# Patient Record
Sex: Male | Born: 1975 | Race: White | Hispanic: No | Marital: Single | Smoking: Former smoker
Health system: Southern US, Community
[De-identification: ages and names within clinical notes are randomized; demographics above are authoritative.]

## PROBLEM LIST (undated history)

## (undated) DIAGNOSIS — E0500 Thyrotoxicosis with diffuse goiter without thyrotoxic crisis or storm: Secondary | ICD-10-CM

## (undated) DIAGNOSIS — R011 Cardiac murmur, unspecified: Secondary | ICD-10-CM

## (undated) HISTORY — DX: Cardiac murmur, unspecified: R01.1

## (undated) HISTORY — PX: WISDOM TOOTH EXTRACTION: SHX21

## (undated) HISTORY — DX: Thyrotoxicosis with diffuse goiter without thyrotoxic crisis or storm: E05.00

---

## 2006-02-12 ENCOUNTER — Emergency Department (HOSPITAL_COMMUNITY): Admission: EM | Admit: 2006-02-12 | Discharge: 2006-02-12 | Payer: Self-pay | Admitting: Emergency Medicine

## 2009-10-19 ENCOUNTER — Emergency Department (HOSPITAL_COMMUNITY): Admission: EM | Admit: 2009-10-19 | Discharge: 2009-10-19 | Payer: Self-pay | Admitting: Emergency Medicine

## 2016-01-08 ENCOUNTER — Ambulatory Visit: Payer: Self-pay | Admitting: Sports Medicine

## 2016-01-15 ENCOUNTER — Encounter: Payer: Self-pay | Admitting: Sports Medicine

## 2016-01-15 ENCOUNTER — Ambulatory Visit (INDEPENDENT_AMBULATORY_CARE_PROVIDER_SITE_OTHER): Payer: Commercial Managed Care - PPO | Admitting: Sports Medicine

## 2016-01-15 VITALS — BP 138/78 | HR 70 | Ht 78.0 in | Wt 258.0 lb

## 2016-01-15 DIAGNOSIS — M67971 Unspecified disorder of synovium and tendon, right ankle and foot: Secondary | ICD-10-CM | POA: Diagnosis not present

## 2016-01-15 DIAGNOSIS — M67879 Other specified disorders of synovium and tendon, unspecified ankle and foot: Secondary | ICD-10-CM

## 2016-01-15 DIAGNOSIS — M7660 Achilles tendinitis, unspecified leg: Secondary | ICD-10-CM

## 2016-01-15 DIAGNOSIS — M67979 Unspecified disorder of synovium and tendon, unspecified ankle and foot: Secondary | ICD-10-CM | POA: Insufficient documentation

## 2016-01-15 MED ORDER — NITROGLYCERIN 0.2 MG/HR TD PT24: MEDICATED_PATCH | TRANSDERMAL | Status: DC

## 2016-01-15 NOTE — Progress Notes (Signed)
Patient ID: Golden CircleWilliam Singleton, male   DOB: 1976-07-16, 40 y.o.   MRN: [id number] HPI: Otherwise-healthy 40 yo man resents for evaluation of right Achilles tendon pain for 1.5 to 2 years. He has notice progressive fullness/knot/swelling in middle of the tendon. He denies specific trauma. Denies fluoroquinolone use. Recreational runner for fitness but feels limited in his ability to run now. Also does [place] martial art for fitness which requires balance and squatting which has become a limitation. Heel cord is tight and sore usually first thing in the morning, and at onset of exercise, but actually feels good while running and after about 5-10 minutes of warm up. He is able to complete exercise without a problem, but is then very sore for 2-3 days after. Thinks overall symptoms may have flared or gotten worse after several months of "minimalist" or barefoot running.  No other LE complaints. Has cuts and scrapes from yard work.  ROS: Denies other complaints today.  PMH: Denies  PE: BP 138/78 mmHg  Pulse 70  Ht 6\' 6"  (1.981 m)  Wt 258 lb (117.028 kg)  BMI 29.82 kg/m2 GEN: WDWN, pleasant and cooperative, NAD  CV/RESP: Nonlabored breathing, skin is warm and dry, no peripheral edema, peripheral circulation grossly intact. Distal pulses symmetrical.   NEURO: Symmetrical facial and limb movement, upper and lower sensation intact to light touch.  MSK: Thickened, ropey heel cord on right, with central nodule, approximately 2 cm proximal to insertion. Insertion of heel cord nontender, calf nontender. Standing heel raises nontender. Resisted dorsi/plantar flexion do not cause pain. Compression of heel cord reproduces pain slightly. Knees and feet are nontender with range of motion. Scattered abrasions and well-healing cuts on lower extremities c/w his history of yard work. No drainage or erythema.  A/P: Acute on chronic right heel cord pain. Ultrasound reveals normal tendon at insertion, 0.42 cm in thickness.  Nodule in tendon has thickness of 1.10 cm without evidence of significant tear or neovascularization.  Rehab exercises provided with heel lifts and nitroglycerin protocol.

## 2016-01-15 NOTE — Assessment & Plan Note (Signed)
NTG protocol Heel lifts NTG protocol Modify exercise  RE ck in 8 wks

## 2016-01-15 NOTE — Patient Instructions (Signed)

## 2016-05-01 ENCOUNTER — Ambulatory Visit: Payer: Commercial Managed Care - PPO | Admitting: Endocrinology

## 2016-05-05 ENCOUNTER — Ambulatory Visit (INDEPENDENT_AMBULATORY_CARE_PROVIDER_SITE_OTHER): Payer: Commercial Managed Care - PPO | Admitting: Endocrinology

## 2016-05-05 ENCOUNTER — Encounter: Payer: Self-pay | Admitting: Endocrinology

## 2016-05-05 VITALS — BP 132/56 | HR 80 | Ht 78.0 in | Wt 258.0 lb

## 2016-05-05 DIAGNOSIS — E0590 Thyrotoxicosis, unspecified without thyrotoxic crisis or storm: Secondary | ICD-10-CM

## 2016-05-05 LAB — T3, FREE: T3, Free: 6.5 pg/mL — ABNORMAL HIGH (ref 2.3–4.2)

## 2016-05-05 LAB — T4, FREE: Free T4: 2.03 ng/dL — ABNORMAL HIGH (ref 0.60–1.60)

## 2016-05-05 MED ORDER — METHIMAZOLE 10 MG PO TABS: 10.0000 mg | ORAL_TABLET | Freq: Three times a day (TID) | ORAL | 1 refills | Status: DC

## 2016-05-05 NOTE — Progress Notes (Signed)
Patient ID: Ian Singleton, male   DOB: Jun 18, 1976, 40 y.o.   MRN: [id number]                                                                                                                Reason for Appointment:  Hyperthyroidism, new consultation  Referring physician: [place]   History of Present Illness:   The patient started having symptoms of palpitations and some shakiness in the early part of the year He also noticed that occasionally he would feel a little sweaty and have difficulty sleeping He was having some weakness in his muscles when trying to lift weights or do squats. Although he tends to fast periodically to lose weight he thinks he has lost 5-10 pounds on his own without trying Does not complain of any consistent heat intolerance, has some fatigue.  When first evaluated with thyroid function tests in 3/17 his free T4 was mildly increased at 1.65, normal <1.12 Follow-up level of free T4 on 6/17 was 1.99  Thyroid stimulating immunoglobulin in 6/17= 189, normal <139  He has taken atenolol with only mild improvement in symptoms He thinks he felt slightly better subjectively with taking bugle weed supplement from [email or web address]  No results found for: FREET4, [employer], TSH      Medication List       Accurate as of 05/05/16  1:10 PM. Always use your most recent med list.          atenolol 25 MG tablet Commonly known as:  TENORMIN   multivitamin capsule Take 1 capsule by mouth daily.   nitroGLYCERIN 0.2 mg/hr patch Commonly known as:  NITRODUR - Dosed in mg/24 hr Place 1/4 patch to affected area daily           No past medical history on file.  No past surgical history on file.  No family history on file.  Social History:  reports that he has never smoked. He does not have any smokeless tobacco history on file. His alcohol and drug histories are not on file.  Allergies: No Known Allergies  Review of Systems:  Review of Systems  Constitutional:  Positive for weight loss. Negative for reduced appetite.  HENT:       He has noticed a change in his swallowing but still is able to swallow well  Cardiovascular: Positive for palpitations.  Gastrointestinal: Negative for diarrhea.  Endocrine: Positive for heat intolerance. Negative for polydipsia.  Musculoskeletal: Negative for joint pain and muscle cramps.  Skin: Positive for itching.       Periodic itching, mostly lower legs recently  Neurological: Positive for weakness and tremors.  Psychiatric/Behavioral:       Occasional insomnia      Examination:   BP (!) 132/56 (BP Location: Left Arm, Patient Position: Sitting, Cuff Size: Normal)   Pulse 80   Ht 6\' 6"  (1.981 m)   Wt 258 lb (117 kg)   SpO2 97%   BMI 29.81 kg/m    General Appearance:  large build He is pleasant,  not anxious or hyperkinetic.        Eyes:  mild stare present, no unusual prominence, lid lag appears absent. No swelling of the eyelids  Neck: The thyroid is minimally enlarged on the right side, difficult to palpate, left side not enlarged There is no lymphadenopathy .          Heart: normal S1 and S2, no murmurs .          Lungs: breath sounds are clear bilaterally Abdomen: no hepatosplenomegaly or other palpable abnormality  Extremities: hands are warm. No ankle edema. Neurological: Deep tendon reflexes at ankles are hyperactive. Bilateral fine tremors are minimally present. Skin: No rash, abnormal thickening of the skin on legs or pigmentation seen     Assessment/Plan:  Hyperthyroidism, Likely to be from Graves' disease    Discussed with the patient the hyperthyroidism as being an autoimmune thyroid disease.  Explained the options for treatment including antithyroid drugs and radioactive iodine.  Discussed the pros and cons for each treatment: Antithyroid drugs would be reasonable for mild disease but would need frequent followup with lab monitoring as well as potential for side effects from the  medications and uncertainty about long-term cure of the problem  Since his condition is relatively mild he probably should do well with antithyroid drugs He will start methimazole 10 mg daily and follow-up in 4 weeks Will need baseline free T4 and free T3 levels as no labs available since last month  Discussed that I-131 treatment is safe and simple to do but will result in long-term hypothyroidism that will result from ablation of the thyroid tissue and the need for lifelong supplementation and periodic monitoring.  Patient handout on hyperthyroidism given Patient understands the above discussion and treatment options. All questions were answered satisfactorily   [facility] 05/05/2016, 1:10 PM    report of consultation has been sent to PCP

## 2016-05-05 NOTE — Progress Notes (Signed)
Please let patient know that the T4 is same, T3 moderately high, ok to use 10mg  Tapazole

## 2016-05-12 ENCOUNTER — Telehealth: Payer: Self-pay | Admitting: Endocrinology

## 2016-05-12 ENCOUNTER — Other Ambulatory Visit: Payer: Self-pay

## 2016-05-12 MED ORDER — METHIMAZOLE 10 MG PO TABS: 10.0000 mg | ORAL_TABLET | Freq: Three times a day (TID) | ORAL | 1 refills | Status: DC

## 2016-05-12 NOTE — Telephone Encounter (Signed)
Refill methimazole (TAPAZOLE) 10 MG has the wrong refill date. please advise

## 2016-05-22 ENCOUNTER — Other Ambulatory Visit: Payer: Self-pay

## 2016-05-22 ENCOUNTER — Telehealth: Payer: Self-pay | Admitting: Endocrinology

## 2016-05-22 ENCOUNTER — Other Ambulatory Visit: Payer: Commercial Managed Care - PPO

## 2016-05-22 MED ORDER — METHIMAZOLE 10 MG PO TABS: 10.0000 mg | ORAL_TABLET | Freq: Three times a day (TID) | ORAL | 1 refills | Status: DC

## 2016-05-22 NOTE — Telephone Encounter (Signed)
Resent in the Methimazole rx for 60 day supply. Other rx was sent in by mistake.

## 2016-05-22 NOTE — Telephone Encounter (Signed)
PT came in and stated that for some reason the Methimazole script was only sent in for 10 days supply and he needs it to be the whole month supply.

## 2016-06-02 ENCOUNTER — Other Ambulatory Visit: Payer: Commercial Managed Care - PPO

## 2016-06-03 ENCOUNTER — Other Ambulatory Visit (INDEPENDENT_AMBULATORY_CARE_PROVIDER_SITE_OTHER): Payer: Commercial Managed Care - PPO

## 2016-06-03 DIAGNOSIS — E0590 Thyrotoxicosis, unspecified without thyrotoxic crisis or storm: Secondary | ICD-10-CM | POA: Diagnosis not present

## 2016-06-03 LAB — T3, FREE: T3, Free: 4.3 pg/mL — ABNORMAL HIGH (ref 2.3–4.2)

## 2016-06-03 LAB — T4, FREE: FREE T4: 1.32 ng/dL (ref 0.60–1.60)

## 2016-06-05 ENCOUNTER — Ambulatory Visit (INDEPENDENT_AMBULATORY_CARE_PROVIDER_SITE_OTHER): Payer: Commercial Managed Care - PPO | Admitting: Endocrinology

## 2016-06-05 ENCOUNTER — Encounter: Payer: Self-pay | Admitting: Endocrinology

## 2016-06-05 VITALS — BP 138/70 | HR 84 | Temp 98.1°F | Wt 258.0 lb

## 2016-06-05 DIAGNOSIS — E0590 Thyrotoxicosis, unspecified without thyrotoxic crisis or storm: Secondary | ICD-10-CM | POA: Diagnosis not present

## 2016-06-05 NOTE — Progress Notes (Signed)
Patient ID: Ian Singleton, male   DOB: 17-Apr-1976, 40 y.o.   MRN: [id number]                                                                                                                Reason for Appointment:  Hyperthyroidism, follow-up  Referring physician: Stoneking   History of Present Illness:   The patient started having symptoms of palpitations and some shakiness in the early part of the year He also noticed that occasionally he would feel a little sweaty and have difficulty sleeping He was having some weakness in his muscles when trying to lift weights or do squats. Although he tends to fast periodically to lose weight he thinks he has lost 5-10 pounds on his own without trying No significant heat intolerance, has had fatigue  When first evaluated with thyroid function tests in 3/17 his free T4 was mildly increased at 1.65, normal <1.12 Follow-up level of free T4 on 6/17 was 1.99  Thyroid stimulating immunoglobulin in 6/17= 189, normal <139  He has taken methimazole 30 mg daily since his initial consultation in 7/17  Subjectively has started feeling better with less palpitations, not as hot and his energy level has improved No side effects from methimazole although he has difficulty trying to take it regularly around lunchtime However has been able to take 3 tablets a day consistently  His labs show improvement  Wt Readings from Last 3 Encounters:  06/05/16 258 lb (117 kg)  05/05/16 258 lb (117 kg)  01/15/16 258 lb (117 kg)    Lab Results  Component Value Date   FREET4 1.32 06/03/2016   FREET4 2.03 (H) 05/05/2016   T3FREE 4.3 (H) 06/03/2016   T3FREE 6.5 (H) 05/05/2016        Medication List       Accurate as of 06/05/16 10:31 AM. Always use your most recent med list.          methimazole 10 MG tablet Commonly known as:  TAPAZOLE Take 1 tablet (10 mg total) by mouth 3 (three) times daily.   multivitamin capsule Take 1 capsule by mouth daily.     nitroGLYCERIN 0.2 mg/hr patch Commonly known as:  NITRODUR - Dosed in mg/24 hr Place 1/4 patch to affected area daily           No past medical history on file.  No past surgical history on file.  Family History  Problem Relation Age of Onset  . Hypothyroidism Mother     Social History:  reports that he has never smoked. He does not have any smokeless tobacco history on file. His alcohol and drug histories are not on file.  Allergies: No Known Allergies  `  Review of Systems    Examination:   BP 138/70 (BP Location: Left Arm, Patient Position: Sitting, Cuff Size: Normal)   Pulse 84   Temp 98.1 F (36.7 C) (Oral)   Wt 258 lb (117 kg)   BMI 29.81 kg/m   Does not look anxious Neck:  The thyroid is minimally enlarged on the right side, difficult to palpate, left side not enlarged   Extremities: hands are warm and slightly moist. No ankle edema. Neurological: Deep tendon reflexes at ankles are hyperactive.  No significant tremor, uses very minimal     Assessment/Plan:   Hyperthyroidism,  from Graves' disease    He is subjectively doing better with starting methimazole 30 mg a day He does still have occasional palpitations and heart rate is slightly high Reflexes are slightly brisk again His thyroid levels are significantly better with free T3 level only minimally increased currently  He will continue the same dose of 30 mg Tapazole daily for another month He can take 20 mg in the morning and 10 mg the evening for convenience He is interested in knowing whether his thyrotropin binding immunoglobulin is improving and will check this on the next visit Also discussed possibility of I-131 treatment if he does not appear to be having improvement in his immunoglobulin levels and continuing to require larger doses of methimazole after about 6 months  Marielle Mantione 06/05/2016, 10:31 AM

## 2016-07-01 ENCOUNTER — Other Ambulatory Visit: Payer: Commercial Managed Care - PPO

## 2016-07-02 ENCOUNTER — Other Ambulatory Visit (INDEPENDENT_AMBULATORY_CARE_PROVIDER_SITE_OTHER): Payer: Commercial Managed Care - PPO

## 2016-07-02 DIAGNOSIS — E0590 Thyrotoxicosis, unspecified without thyrotoxic crisis or storm: Secondary | ICD-10-CM | POA: Diagnosis not present

## 2016-07-02 LAB — T4, FREE: FREE T4: 0.76 ng/dL (ref 0.60–1.60)

## 2016-07-02 LAB — T3, FREE: T3 FREE: 3.4 pg/mL (ref 2.3–4.2)

## 2016-07-02 LAB — TSH: TSH: 0.01 u[IU]/mL — ABNORMAL LOW (ref 0.35–4.50)

## 2016-07-03 LAB — THYROTROPIN RECEPTOR AUTOABS: THYROTROPIN RECEPTOR AB: 1.37 IU/L (ref 0.00–1.75)

## 2016-07-04 ENCOUNTER — Encounter: Payer: Self-pay | Admitting: Endocrinology

## 2016-07-04 ENCOUNTER — Ambulatory Visit (INDEPENDENT_AMBULATORY_CARE_PROVIDER_SITE_OTHER): Payer: Commercial Managed Care - PPO | Admitting: Endocrinology

## 2016-07-04 VITALS — BP 122/76 | HR 74 | Temp 98.0°F | Resp 16 | Ht 78.0 in | Wt 263.6 lb

## 2016-07-04 DIAGNOSIS — E0590 Thyrotoxicosis, unspecified without thyrotoxic crisis or storm: Secondary | ICD-10-CM

## 2016-07-04 NOTE — Progress Notes (Signed)
Patient ID: Ian Singleton, male   DOB: [birth year], 40 y.o.   MRN: [id number]                                                                                                                Reason for Appointment:  Hyperthyroidism, follow-up  Referring physician: Stoneking   History of Present Illness:   The patient started having symptoms of palpitations and some shakiness in the early part of the year He also noticed that occasionally he would feel a little sweaty and have difficulty sleeping He was having some weakness in his muscles when trying to lift weights or do squats. Although he tends to fast periodically to lose weight he thinks he has lost 5-10 pounds on his own without trying No significant heat intolerance, has had fatigue  When first evaluated with thyroid function tests in 3/17 his free T4 was mildly increased at 1.65, normal <1.12 Follow-up level of free T4 on 6/17 was 1.99  Thyroid stimulating immunoglobulin in 6/17= 189, normal <139  He has taken methimazole 30 mg daily since his initial consultation in 7/17, the dose was not reduced on his last visit as free T3 was still slightly high  Subjectively has done even better than his last visit with no further palpitations, heat intolerance or fatigue No side effects from methimazole  Also has been able to take 3 tablets a day consistently  His labs show free T4 and T3 normal and thyrotropin receptor antibody is normal at 1.37  Wt Readings from Last 3 Encounters:  07/04/16 263 lb 9.6 oz (119.6 kg)  06/05/16 258 lb (117 kg)  05/05/16 258 lb (117 kg)    Lab Results  Component Value Date   FREET4 0.76 07/02/2016   FREET4 1.32 06/03/2016   FREET4 2.03 (H) 05/05/2016   T3FREE 3.4 07/02/2016   T3FREE 4.3 (H) 06/03/2016   T3FREE 6.5 (H) 05/05/2016   TSH 0.01 (L) 07/02/2016        Medication List       Accurate as of 07/04/16  9:16 AM. Always use your most recent med list.          methimazole 10 MG  tablet Commonly known as:  TAPAZOLE Take 1 tablet (10 mg total) by mouth 3 (three) times daily.   multivitamin capsule Take 1 capsule by mouth daily.   nitroGLYCERIN 0.2 mg/hr patch Commonly known as:  NITRODUR - Dosed in mg/24 hr Place 1/4 patch to affected area daily           No past medical history on file.  No past surgical history on file.  Family History  Problem Relation Age of Onset  . Hypothyroidism Mother     Social History:  reports that he has never smoked. He does not have any smokeless tobacco history on file. His alcohol and drug histories are not on file.  Allergies: No Known Allergies  `  Review of Systems    Examination:   BP 122/76   Pulse 74  Temp 98 F (36.7 C)   Resp 16   Ht 6\' 6"  (1.981 m)   Wt 263 lb 9.6 oz (119.6 kg)   SpO2 98%   BMI 30.46 kg/m   Neck: The thyroid is Not palpable  Neurological: Deep tendon reflexes at biceps are normal Skin is slightly warm      Assessment/Plan:   Hyperthyroidism,  from Graves' disease    He is symptomatically much improved with methimazole 30 mg daily He looks euthyroid and the thyroid is not palpable His thyroid levels are back to normal as also the thyrotropin receptor antibody which is fairly quick  He will not reduce his dose to 15 mg a day and follow-up in 1 month again, hopefully can taper off his methimazole fairly quickly Discussed possibility of hypothyroid symptoms  Ian Singleton 07/04/2016, 9:16 AM

## 2016-07-04 NOTE — Patient Instructions (Addendum)
Take 1 1/2 pills in am of Methimazole

## 2016-08-15 ENCOUNTER — Other Ambulatory Visit (INDEPENDENT_AMBULATORY_CARE_PROVIDER_SITE_OTHER): Payer: Commercial Managed Care - PPO

## 2016-08-15 DIAGNOSIS — E0590 Thyrotoxicosis, unspecified without thyrotoxic crisis or storm: Secondary | ICD-10-CM | POA: Diagnosis not present

## 2016-08-15 LAB — TSH: TSH: 1.62 u[IU]/mL (ref 0.35–4.50)

## 2016-08-15 LAB — T4, FREE: FREE T4: 0.57 ng/dL — AB (ref 0.60–1.60)

## 2016-08-15 LAB — T3, FREE: T3, Free: 3 pg/mL (ref 2.3–4.2)

## 2016-08-19 ENCOUNTER — Ambulatory Visit (INDEPENDENT_AMBULATORY_CARE_PROVIDER_SITE_OTHER): Payer: Commercial Managed Care - PPO | Admitting: Endocrinology

## 2016-08-19 ENCOUNTER — Encounter: Payer: Self-pay | Admitting: Endocrinology

## 2016-08-19 VITALS — BP 128/78 | HR 74 | Ht 78.0 in | Wt 273.0 lb

## 2016-08-19 DIAGNOSIS — E0590 Thyrotoxicosis, unspecified without thyrotoxic crisis or storm: Secondary | ICD-10-CM | POA: Diagnosis not present

## 2016-08-19 NOTE — Patient Instructions (Signed)
1/2 PILL DAILY

## 2016-08-19 NOTE — Progress Notes (Signed)
Patient ID: Ian CircleWilliam Singleton, male   DOB: [birth year], 40 y.o.   MRN: [id number]                                                                                                                Reason for Appointment:  Hyperthyroidism, follow-up  Referring physician: Stoneking   History of Present Illness:   The patient started having symptoms of palpitations and some shakiness in the early part of the year He also noticed that occasionally he would feel a little sweaty and have difficulty sleeping He was having some weakness in his muscles when trying to lift weights or do squats. Although he tends to fast periodically to lose weight he thinks he has lost 5-10 pounds on his own without trying No significant heat intolerance, has had fatigue  When first evaluated with thyroid function tests in 3/17 his free T4 was mildly increased at 1.65, normal <1.12 Follow-up level of free T4 on 6/17 was 1.99  Thyroid stimulating immunoglobulin in 6/17= 189, normal <139  He had taken methimazole 30 mg daily since his initial consultation in 7/17, the dose was reduced on his last visit as free T4 was down to 0.76 Currently taking 15 mg a day Subjectively has done fairly well and does not complain of fatigue No cold intolerance but has gained weight He still does exercise periodically  His labs show free T4 below normal now and TSH back in normal range In September thyrotropin receptor antibody is normal at 1.37  Wt Readings from Last 3 Encounters:  08/19/16 273 lb (123.8 kg)  07/04/16 263 lb 9.6 oz (119.6 kg)  06/05/16 258 lb (117 kg)    Lab Results  Component Value Date   FREET4 0.57 (L) 08/15/2016   FREET4 0.76 07/02/2016   FREET4 1.32 06/03/2016   T3FREE 3.0 08/15/2016   T3FREE 3.4 07/02/2016   T3FREE 4.3 (H) 06/03/2016   TSH 1.62 08/15/2016   TSH 0.01 (L) 07/02/2016        Medication List       Accurate as of 08/19/16 11:01 AM. Always use your most recent med list.          methimazole 10 MG tablet Commonly known as:  TAPAZOLE Take 1 tablet (10 mg total) by mouth 3 (three) times daily.   multivitamin capsule Take 1 capsule by mouth daily.   nitroGLYCERIN 0.2 mg/hr patch Commonly known as:  NITRODUR - Dosed in mg/24 hr Place 1/4 patch to affected area daily           No past medical history on file.  No past surgical history on file.  Family History  Problem Relation Age of Onset  . Hypothyroidism Mother     Social History:  reports that he has never smoked. He does not have any smokeless tobacco history on file. His alcohol and drug histories are not on file.  Allergies: No Known Allergies  `  Review of Systems    Examination:   BP 128/78   Pulse  74   Ht 6\' 6"  (1.981 m)   Wt 273 lb (123.8 kg)   SpO2 98%   BMI 31.55 kg/m   Neck: The thyroid is Not palpable  Neurological: Deep tendon reflexes at biceps are normal      Assessment/Plan:   Hyperthyroidism,  from Graves' disease    He is symptomatically Doing well His thyroid levels have come down fairly quickly now and is mildly hypothyroid with low free T4 TSH is back to normal Currently on 15 mg methimazole  Since his thyrotropin receptor antibody was normal in September he is likely in remission  He will cut his methimazole to half tablet daily for 2 weeks and then stop Follow-up in about a month  Ian Singleton 08/19/2016, 11:01 AM

## 2016-10-31 ENCOUNTER — Other Ambulatory Visit: Payer: Commercial Managed Care - PPO

## 2016-11-02 ENCOUNTER — Other Ambulatory Visit: Payer: Self-pay | Admitting: Endocrinology

## 2016-11-03 ENCOUNTER — Other Ambulatory Visit (INDEPENDENT_AMBULATORY_CARE_PROVIDER_SITE_OTHER): Payer: Commercial Managed Care - PPO

## 2016-11-03 DIAGNOSIS — E0590 Thyrotoxicosis, unspecified without thyrotoxic crisis or storm: Secondary | ICD-10-CM | POA: Diagnosis not present

## 2016-11-03 LAB — T4, FREE: FREE T4: 0.88 ng/dL (ref 0.60–1.60)

## 2016-11-03 LAB — TSH: TSH: 0.45 u[IU]/mL (ref 0.35–4.50)

## 2016-11-04 LAB — THYROTROPIN RECEPTOR AUTOABS: THYROTROPIN RECEPTOR AB: 1.08 IU/L (ref 0.00–1.75)

## 2016-11-05 ENCOUNTER — Ambulatory Visit: Payer: Commercial Managed Care - PPO | Admitting: Endocrinology

## 2017-06-22 ENCOUNTER — Other Ambulatory Visit: Payer: Commercial Managed Care - PPO

## 2017-06-24 ENCOUNTER — Other Ambulatory Visit: Payer: Self-pay | Admitting: Endocrinology

## 2017-06-24 ENCOUNTER — Other Ambulatory Visit (INDEPENDENT_AMBULATORY_CARE_PROVIDER_SITE_OTHER): Payer: Commercial Managed Care - PPO

## 2017-06-24 DIAGNOSIS — E0590 Thyrotoxicosis, unspecified without thyrotoxic crisis or storm: Secondary | ICD-10-CM

## 2017-06-24 LAB — TSH: TSH: 1.74 u[IU]/mL (ref 0.35–4.50)

## 2017-06-24 LAB — T4, FREE: Free T4: 0.84 ng/dL (ref 0.60–1.60)

## 2017-06-24 LAB — T3, FREE: T3, Free: 3.1 pg/mL (ref 2.3–4.2)

## 2017-06-25 ENCOUNTER — Ambulatory Visit (INDEPENDENT_AMBULATORY_CARE_PROVIDER_SITE_OTHER): Payer: Commercial Managed Care - PPO | Admitting: Endocrinology

## 2017-06-25 VITALS — BP 138/82 | HR 60 | Temp 98.2°F | Ht 78.0 in | Wt 281.6 lb

## 2017-06-25 DIAGNOSIS — E0590 Thyrotoxicosis, unspecified without thyrotoxic crisis or storm: Secondary | ICD-10-CM

## 2017-06-25 NOTE — Progress Notes (Signed)
Patient ID: Ian Singleton, male   DOB: 08-Apr-1976, 41 y.o.   MRN: [id number]                                                                                                                Reason for Appointment:  Hyperthyroidism, follow-up  Referring physician: Stoneking   History of Present Illness:   The patient started having symptoms of palpitations and some shakiness in the early part of the year He also noticed that occasionally he would feel a little sweaty and have difficulty sleeping He was having some weakness in his muscles when trying to lift weights or do squats. Although he tends to fast periodically to lose weight he thinks he has lost 5-10 pounds on his own without trying No significant heat intolerance, has had fatigue  When first evaluated with thyroid function tests in 3/17 his free T4 was mildly increased at 1.65, normal <1.12 Follow-up level of free T4 on 6/17 was 1.99  Thyroid stimulating immunoglobulin in 6/17= 189, normal <139  He had taken methimazole 30 mg daily since his initial consultation in 7/17, the dose was reduced on his last visit as free T4 was down to 0.76  On his last visit in 11/17 his free T4 was mildly decreased on 15 mg methimazole He was told to cut the dose down to 5 mg for 2 weeks and then stop  Although he had labs done in 1/18 that were normal he did not come in for office visit  RECENT history: About 2 months ago he started having some palpitations, feeling hot and anxious feeling On his own he started back on 5 mg methimazole without consulting [place] or having any labs Currently he feels better with no further anxiety, palpitations, fatigue or heat intolerance He appears to have gained weight since last year  His labs show normal free T4 and also TSH is normal now   Most recent thyrotropin receptor antibody is also normal in 1/18  Wt Readings from Last 3 Encounters:  06/25/17 281 lb 9.6 oz (127.7 kg)  08/19/16 273 lb (123.8 kg)    07/04/16 263 lb 9.6 oz (119.6 kg)    Lab Results  Component Value Date   FREET4 0.84 06/24/2017   FREET4 0.88 11/03/2016   FREET4 0.57 (L) 08/15/2016   T3FREE 3.1 06/24/2017   T3FREE 3.0 08/15/2016   T3FREE 3.4 07/02/2016   TSH 1.74 06/24/2017   TSH 0.45 11/03/2016   TSH 1.62 08/15/2016      Allergies as of 06/25/2017   No Known Allergies     Medication List       Accurate as of 06/25/17  3:54 PM. Always use your most recent med list.          methimazole 10 MG tablet Commonly known as:  TAPAZOLE Take 1 tablet (10 mg total) by mouth 3 (three) times daily.   multivitamin capsule Take 1 capsule by mouth daily.   nitroGLYCERIN 0.2 mg/hr patch Commonly known as:  NITRODUR - Dosed  in mg/24 hr Place 1/4 patch to affected area daily           No past medical history on file.  No past surgical history on file.  Family History  Problem Relation Age of Onset  . Hypothyroidism Mother     Social History:  reports that he has never smoked. He does not have any smokeless tobacco history on file. His alcohol and drug histories are not on file.  Allergies: No Known Allergies  `  Review of Systems    Examination:   BP 138/82 (BP Location: Left Arm, Patient Position: Sitting, Cuff Size: Normal)   Pulse 60   Temp 98.2 F (36.8 C) (Oral)   Ht  (1.981 m)   Wt 281 lb 9.6 oz (127.7 kg)   SpO2 96%   BMI 32.54 kg/m   Neck: The thyroid is not palpable  Deep tendon reflexes at biceps are normal No tremor present     Assessment/Plan:   Hyperthyroidism,  from Graves' disease    Although he had not required any medication and his Graves' disease was in remission as of 1/18 he appears to have had a recurrence at least in the last couple of months  He has not been regular with his follow-up and has been taking 5 mg methimazole on his own without repeating baseline labs With 5 mg daily he symptomatically doing well and his thyroid levels are back to  normal  Advised patient that he needs regular follow-up and his treatment needs to be supervised in the office regularly Also discussed that if he has any tendency to persistent hyperthyroidism or several recurrences then he should have I-131 treatment  He will come back in 2 months for follow-up labs and repeat thyrotropin receptor antibody  [facility] 06/25/2017, 3:54 PM

## 2017-06-26 MED ORDER — METHIMAZOLE 5 MG PO TABS: 5.0000 mg | ORAL_TABLET | Freq: Every day | ORAL | 1 refills | Status: DC

## 2017-08-17 ENCOUNTER — Other Ambulatory Visit (INDEPENDENT_AMBULATORY_CARE_PROVIDER_SITE_OTHER): Payer: Commercial Managed Care - PPO

## 2017-08-17 DIAGNOSIS — E0590 Thyrotoxicosis, unspecified without thyrotoxic crisis or storm: Secondary | ICD-10-CM

## 2017-08-17 LAB — TSH: TSH: 0.62 u[IU]/mL (ref 0.35–4.50)

## 2017-08-17 LAB — T4, FREE: Free T4: 0.95 ng/dL (ref 0.60–1.60)

## 2017-08-18 LAB — THYROTROPIN RECEPTOR AUTOABS: THYROTROPIN RECEPTOR AB: 0.53 IU/L (ref 0.00–1.75)

## 2017-08-20 ENCOUNTER — Ambulatory Visit (INDEPENDENT_AMBULATORY_CARE_PROVIDER_SITE_OTHER): Payer: Commercial Managed Care - PPO | Admitting: Endocrinology

## 2017-08-20 ENCOUNTER — Encounter: Payer: Self-pay | Admitting: Endocrinology

## 2017-08-20 VITALS — BP 140/90 | HR 79 | Ht 78.0 in | Wt 282.6 lb

## 2017-08-20 DIAGNOSIS — E0590 Thyrotoxicosis, unspecified without thyrotoxic crisis or storm: Secondary | ICD-10-CM | POA: Diagnosis not present

## 2017-08-20 NOTE — Progress Notes (Signed)
Patient ID: Ian Singleton, male   DOB: 1975/12/15, 41 y.o.   MRN: [id number]                                                                                                                Reason for Appointment:  Hyperthyroidism, follow-up  Referring physician: [place]   History of Present Illness:   The patient started having symptoms of palpitations and some shakiness in the early part of the year He also noticed that occasionally he would feel a little sweaty and have difficulty sleeping He was having some weakness in his muscles when trying to lift weights or do squats. Although he tends to fast periodically to lose weight he thinks he has lost 5-10 pounds on his own without trying No significant heat intolerance, has had fatigue  When first evaluated with thyroid function tests in 3/17 his free T4 was mildly increased at 1.65, normal <1.12 Follow-up level of free T4 on 6/17 was 1.99  Thyroid stimulating immunoglobulin in 6/17= 189, normal <139  He had taken methimazole 30 mg daily since his initial consultation in 04/2016, the dose was reduced on his last visit as free T4 was down to 0.76  On his last visit in 11/17 his free T4 was mildly decreased on 15 mg methimazole He was told to cut the dose down to 5 mg for 2 weeks and then stop  Although he had labs done in 1/18 that were normal he did not come in for office visit  RECENT history: In July 2018 he started having some palpitations, feeling hot and anxious feeling On his own he started back on 5 mg methimazole without consulting us or having any labs Subsequently he has had good control of these symptoms Since his free T4 and TSH were quite normal in September he was continued on the same dose  Currently he feels better with no further agitation, palpitations, fatigue or weight change  His labs show normal free T4 and also TSH is normal although slightly lower  Most recent thyrotropin receptor antibody is also normal  and improved  Wt Readings from Last 3 Encounters:  08/20/17 282 lb 9.6 oz (128.2 kg)  06/25/17 281 lb 9.6 oz (127.7 kg)  08/19/16 273 lb (123.8 kg)    Lab Results  Component Value Date   FREET4 0.95 08/17/2017   FREET4 0.84 06/24/2017   FREET4 0.88 11/03/2016   T3FREE 3.1 06/24/2017   T3FREE 3.0 08/15/2016   T3FREE 3.4 07/02/2016   TSH 0.62 08/17/2017   TSH 1.74 06/24/2017   TSH 0.45 11/03/2016   Lab Results  Component Value Date   THYROTRECAB 0.53 08/17/2017   THYROTRECAB 1.08 11/03/2016   THYROTRECAB 1.37 07/02/2016      Allergies as of 08/20/2017   No Known Allergies     Medication List        Accurate as of 08/20/17  9:09 AM. Always use your most recent med list.          methimazole 5 MG  tablet Commonly known as:  TAPAZOLE Take 1 tablet (5 mg total) by mouth daily.   multivitamin capsule Take 1 capsule by mouth daily.   nitroGLYCERIN 0.2 mg/hr patch Commonly known as:  NITRODUR - Dosed in mg/24 hr Place 1/4 patch to affected area daily           No past medical history on file.  No past surgical history on file.  Family History  Problem Relation Age of Onset  . Hypothyroidism Mother     Social History:  reports that  has never smoked. he has never used smokeless tobacco. His alcohol and drug histories are not on file.  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    He has been on treatment with methimazole again since July With 5 mg at times on his thyroid levels are back to normal Objectively he is doing well, no thyroid enlargement May have minimal ophthalmopathy  which is stable also  Although his thyrotropin receptor antibody is low normal is TSH is still on the lower end of the range and unlikely to be able to get off his methimazole at this time He was stay on the same dose for another 2 months and we will review his thyroid levels again, may be able to reduce his methimazole when his TSH is high normal All questions answered  Sylwia Cuervo 08/20/2017, 9:09 AM

## 2017-09-07 ENCOUNTER — Other Ambulatory Visit: Payer: Self-pay | Admitting: Endocrinology

## 2018-03-29 DIAGNOSIS — E0590 Thyrotoxicosis, unspecified without thyrotoxic crisis or storm: Secondary | ICD-10-CM | POA: Diagnosis not present

## 2018-03-29 DIAGNOSIS — Z79899 Other long term (current) drug therapy: Secondary | ICD-10-CM | POA: Diagnosis not present

## 2018-03-29 DIAGNOSIS — Z0000 Encounter for general adult medical examination without abnormal findings: Secondary | ICD-10-CM | POA: Diagnosis not present

## 2018-04-17 ENCOUNTER — Emergency Department (HOSPITAL_COMMUNITY): Payer: Commercial Managed Care - PPO

## 2018-04-17 ENCOUNTER — Other Ambulatory Visit: Payer: Self-pay

## 2018-04-17 ENCOUNTER — Emergency Department (HOSPITAL_COMMUNITY)
Admission: EM | Admit: 2018-04-17 | Discharge: 2018-04-17 | Disposition: A | Discharge status: Home / Self Care | Payer: Commercial Managed Care - PPO | Attending: Emergency Medicine | Admitting: Emergency Medicine

## 2018-04-17 ENCOUNTER — Encounter (HOSPITAL_COMMUNITY): Payer: Self-pay

## 2018-04-17 DIAGNOSIS — Y999 Unspecified external cause status: Secondary | ICD-10-CM | POA: Insufficient documentation

## 2018-04-17 DIAGNOSIS — S299XXA Unspecified injury of thorax, initial encounter: Secondary | ICD-10-CM | POA: Diagnosis not present

## 2018-04-17 DIAGNOSIS — Y9389 Activity, other specified: Secondary | ICD-10-CM | POA: Insufficient documentation

## 2018-04-17 DIAGNOSIS — S42032A Displaced fracture of lateral end of left clavicle, initial encounter for closed fracture: Secondary | ICD-10-CM | POA: Diagnosis not present

## 2018-04-17 DIAGNOSIS — S0990XA Unspecified injury of head, initial encounter: Secondary | ICD-10-CM | POA: Diagnosis not present

## 2018-04-17 DIAGNOSIS — S2242XA Multiple fractures of ribs, left side, initial encounter for closed fracture: Secondary | ICD-10-CM | POA: Diagnosis not present

## 2018-04-17 DIAGNOSIS — S22000A Wedge compression fracture of unspecified thoracic vertebra, initial encounter for closed fracture: Secondary | ICD-10-CM | POA: Diagnosis not present

## 2018-04-17 DIAGNOSIS — Y92410 Unspecified street and highway as the place of occurrence of the external cause: Secondary | ICD-10-CM | POA: Insufficient documentation

## 2018-04-17 DIAGNOSIS — S060X0A Concussion without loss of consciousness, initial encounter: Secondary | ICD-10-CM

## 2018-04-17 DIAGNOSIS — S42102A Fracture of unspecified part of scapula, left shoulder, initial encounter for closed fracture: Secondary | ICD-10-CM | POA: Diagnosis not present

## 2018-04-17 DIAGNOSIS — S40912A Unspecified superficial injury of left shoulder, initial encounter: Secondary | ICD-10-CM | POA: Diagnosis present

## 2018-04-17 LAB — CBC WITH DIFFERENTIAL/PLATELET
ABS IMMATURE GRANULOCYTES: 0.1 10*3/uL (ref 0.0–0.1)
Basophils Absolute: 0.1 10*3/uL (ref 0.0–0.1)
Basophils Relative: 0 %
EOS PCT: 0 %
Eosinophils Absolute: 0 10*3/uL (ref 0.0–0.7)
HCT: 47.1 % (ref 39.0–52.0)
HEMOGLOBIN: 15.6 g/dL (ref 13.0–17.0)
IMMATURE GRANULOCYTES: 1 %
LYMPHS ABS: 1.6 10*3/uL (ref 0.7–4.0)
LYMPHS PCT: 9 %
MCH: 30.6 pg (ref 26.0–34.0)
MCHC: 33.1 g/dL (ref 30.0–36.0)
MCV: 92.4 fL (ref 78.0–100.0)
Monocytes Absolute: 1.6 10*3/uL — ABNORMAL HIGH (ref 0.1–1.0)
Monocytes Relative: 10 %
NEUTROS PCT: 80 %
Neutro Abs: 13.5 10*3/uL — ABNORMAL HIGH (ref 1.7–7.7)
Platelets: 265 10*3/uL (ref 150–400)
RBC: 5.1 MIL/uL (ref 4.22–5.81)
RDW: 11.9 % (ref 11.5–15.5)
WBC: 16.9 10*3/uL — AB (ref 4.0–10.5)

## 2018-04-17 LAB — COMPREHENSIVE METABOLIC PANEL
ALBUMIN: 4.6 g/dL (ref 3.5–5.0)
ALK PHOS: 78 U/L (ref 38–126)
ALT: 33 U/L (ref 0–44)
AST: 28 U/L (ref 15–41)
Anion gap: 9 (ref 5–15)
BUN: 11 mg/dL (ref 6–20)
CO2: 25 mmol/L (ref 22–32)
Calcium: 9.7 mg/dL (ref 8.9–10.3)
Chloride: 107 mmol/L (ref 98–111)
Creatinine, Ser: 0.98 mg/dL (ref 0.61–1.24)
GFR calc Af Amer: >60 mL/min (ref >60)
GFR calc non Af Amer: >60 mL/min (ref >60)
GLUCOSE: 109 mg/dL — AB (ref 70–99)
Potassium: 4.2 mmol/L (ref 3.5–5.1)
Sodium: 141 mmol/L (ref 135–145)
Total Bilirubin: 0.8 mg/dL (ref 0.3–1.2)
Total Protein: 7.9 g/dL (ref 6.5–8.1)

## 2018-04-17 MED ORDER — LORAZEPAM 2 MG/ML IJ SOLN
0.5000 mg | Freq: Once | INTRAMUSCULAR | Status: AC
  Administered 2018-04-17: 0.5 mg via INTRAVENOUS
  Filled 2018-04-17: qty 1

## 2018-04-17 MED ORDER — HYDROCODONE-ACETAMINOPHEN 5-325 MG PO TABS
1.0000 | ORAL_TABLET | Freq: Once | ORAL | Status: AC
  Administered 2018-04-17: 1 via ORAL
  Filled 2018-04-17: qty 1

## 2018-04-17 MED ORDER — FENTANYL CITRATE (PF) 100 MCG/2ML IJ SOLN
50.0000 ug | Freq: Once | INTRAMUSCULAR | Status: DC
  Filled 2018-04-17: qty 2

## 2018-04-17 MED ORDER — METHOCARBAMOL 500 MG PO TABS: 1000.0000 mg | ORAL_TABLET | Freq: Three times a day (TID) | ORAL | 0 refills | Status: DC | PRN

## 2018-04-17 MED ORDER — IOHEXOL 300 MG/ML  SOLN
75.0000 mL | Freq: Once | INTRAMUSCULAR | Status: AC | PRN
  Administered 2018-04-17: 100 mL via INTRAVENOUS

## 2018-04-17 MED ORDER — HYDROCODONE-ACETAMINOPHEN 5-325 MG PO TABS: 1.0000 | ORAL_TABLET | ORAL | 0 refills | Status: DC | PRN

## 2018-04-17 NOTE — ED Notes (Signed)
Pt refused narcotics.  MD notified.
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
## 2018-04-17 NOTE — ED Notes (Signed)
Patient transported to X-ray 
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
## 2018-04-20 DIAGNOSIS — S42022A Displaced fracture of shaft of left clavicle, initial encounter for closed fracture: Secondary | ICD-10-CM | POA: Diagnosis not present

## 2018-04-20 DIAGNOSIS — S2242XA Multiple fractures of ribs, left side, initial encounter for closed fracture: Secondary | ICD-10-CM | POA: Diagnosis not present

## 2018-05-25 DIAGNOSIS — S42002D Fracture of unspecified part of left clavicle, subsequent encounter for fracture with routine healing: Secondary | ICD-10-CM | POA: Diagnosis not present

## 2018-06-23 DIAGNOSIS — H5213 Myopia, bilateral: Secondary | ICD-10-CM | POA: Diagnosis not present

## 2018-06-23 DIAGNOSIS — H43813 Vitreous degeneration, bilateral: Secondary | ICD-10-CM | POA: Diagnosis not present

## 2018-06-23 DIAGNOSIS — H52223 Regular astigmatism, bilateral: Secondary | ICD-10-CM | POA: Diagnosis not present

## 2018-07-16 DIAGNOSIS — S42022A Displaced fracture of shaft of left clavicle, initial encounter for closed fracture: Secondary | ICD-10-CM | POA: Diagnosis not present

## 2018-07-16 DIAGNOSIS — S42022D Displaced fracture of shaft of left clavicle, subsequent encounter for fracture with routine healing: Secondary | ICD-10-CM | POA: Diagnosis not present

## 2018-10-07 ENCOUNTER — Telehealth: Payer: Self-pay

## 2018-10-07 ENCOUNTER — Other Ambulatory Visit: Payer: Commercial Managed Care - PPO

## 2018-10-07 ENCOUNTER — Other Ambulatory Visit: Payer: Self-pay | Admitting: Endocrinology

## 2018-10-07 DIAGNOSIS — E0590 Thyrotoxicosis, unspecified without thyrotoxic crisis or storm: Secondary | ICD-10-CM | POA: Diagnosis not present

## 2018-10-07 NOTE — Telephone Encounter (Signed)
DONE

## 2018-10-07 NOTE — Addendum Note (Signed)
Addended by: Warden FillersWRIGHT, LATOYA S on: 10/07/2018 10:49 AM   Modules accepted: Orders

## 2018-10-07 NOTE — Telephone Encounter (Signed)
Pt's last orders for labs are from 2018 and [facility] lab in  cannot see them for some reason. Will you please order more or tell me what you want ordered so the pt can have them drawn this morning for his appt. Later today.

## 2018-10-08 LAB — TSH: TSH: 0.855 u[IU]/mL (ref 0.450–4.500)

## 2018-10-08 LAB — THYROTROPIN RECEPTOR AUTOABS

## 2018-10-08 LAB — T3, FREE: T3 FREE: 3.6 pg/mL (ref 2.0–4.4)

## 2018-10-08 LAB — T4, FREE: Free T4: 1.38 ng/dL (ref 0.82–1.77)

## 2018-10-11 ENCOUNTER — Ambulatory Visit (INDEPENDENT_AMBULATORY_CARE_PROVIDER_SITE_OTHER): Payer: Commercial Managed Care - PPO | Admitting: Endocrinology

## 2018-10-11 ENCOUNTER — Encounter: Payer: Self-pay | Admitting: Endocrinology

## 2018-10-11 VITALS — BP 114/70 | HR 75 | Ht 78.0 in | Wt 286.0 lb

## 2018-10-11 DIAGNOSIS — E0590 Thyrotoxicosis, unspecified without thyrotoxic crisis or storm: Secondary | ICD-10-CM | POA: Diagnosis not present

## 2018-10-11 NOTE — Patient Instructions (Addendum)
Stop Rx  No Vitamin for 2 weeks prior to test

## 2018-10-11 NOTE — Progress Notes (Signed)
Patient ID: Ian Singleton, male   DOB: [birth year], 43 y.o.   MRN: [id number]                                                                                                                Reason for Appointment:  Hyperthyroidism, follow-up  Referring physician: [place]   History of Present Illness:   The patient started having symptoms of palpitations and some shakiness in the early part of the year He also noticed that occasionally he would feel a little sweaty and have difficulty sleeping He was having some weakness in his muscles when trying to lift weights or do squats. Although he tends to fast periodically to lose weight he thinks he has lost 5-10 pounds on his own without trying No significant heat intolerance, has had fatigue  When first evaluated with thyroid function tests in 3/17 his free T4 was mildly increased at 1.65, normal <1.12 Follow-up level of free T4 on 6/17 was 1.99  Thyroid stimulating immunoglobulin in 6/17= 189, normal <139  He had taken methimazole 30 mg daily since his initial consultation in 04/2016, the dose was reduced on his last visit as free T4 was down to 0.76  Previously and 11/17 his free T4 was mildly decreased on 15 mg methimazole He was told to cut the dose down to 5 mg for 2 weeks and then stop  RECENT history: In July 2018 he started having some palpitations, feeling hot and anxious feeling On his own he started back on 5 mg methimazole without consulting [place] or having any labs Since his free T4 and TSH were quite normal in September 2018 he was continued on the same dose  However he has not been in follow-up since 08/2017 when he was told to continue 5 mg daily of methimazole  He says that he has been taking methimazole on and off on his own depending on how he is feeling If he starts feeling a little hot or having irritability or anxious feeling he will take 5 mg of methimazole for 2 or 3 days until he feels better He did take some  methimazole the week he had his labs  His labs show normal free T4 and T3 also TSH is normal Free T4 is slightly higher than before but he also takes a vitamin with 300 mg of biotin  The thyrotropin receptor antibody is also again normal  Wt Readings from Last 3 Encounters:  10/11/18 286 lb (129.7 kg)  08/20/17 282 lb 9.6 oz (128.2 kg)  06/25/17 281 lb 9.6 oz (127.7 kg)    Lab Results  Component Value Date   FREET4 1.38 10/07/2018   FREET4 0.95 08/17/2017   FREET4 0.84 06/24/2017   T3FREE 3.6 10/07/2018   T3FREE 3.1 06/24/2017   T3FREE 3.0 08/15/2016   TSH 0.855 10/07/2018   TSH 0.62 08/17/2017   TSH 1.74 06/24/2017   Lab Results  Component Value Date   THYROTRECAB <1.10 10/07/2018   THYROTRECAB 0.53 08/17/2017   THYROTRECAB 1.08 11/03/2016  THYROTRECAB 1.37 07/02/2016      Allergies as of 10/11/2018   No Known Allergies     Medication List       Accurate as of October 11, 2018 11:22 AM. Always use your most recent med list.        methimazole 10 MG tablet Commonly known as:  TAPAZOLE Take 5 mg by mouth every other day.   multivitamin with minerals Tabs tablet Take 1 tablet by mouth daily.   VITAMIN E PO Take 1 capsule by mouth daily.           History reviewed. No pertinent past medical history.  History reviewed. No pertinent surgical history.  Family History  Problem Relation Age of Onset  . Hypothyroidism Mother     Social History:  reports that he has never smoked. He has never used smokeless tobacco. No history on file for alcohol and drug.  Allergies: No Known Allergies  `  Review of Systems    Examination:   BP 114/70 (BP Location: Left Arm, Patient Position: Sitting, Cuff Size: Normal)   Pulse 75   Ht 6\' 6"  (1.981 m)   Wt 286 lb (129.7 kg)   SpO2 97%   BMI 33.05 kg/m    He has mild prominence of his eyes especially right with slight lower lid retraction Thyroid not palpable Deep tendon reflexes at biceps are appearing  normal Skin appears normal Only minimal tremor on the left hand present   Assessment/Plan:   Hyperthyroidism,  from Graves' disease initially diagnosed in 03/2016   He has been on treatment with methimazole since 7/18 However he has not been followed here for over a year now  The patient tries to medicate himself based on his symptoms without any regular follow-up for lab monitoring Thyrotropin receptor antibody is normal again  Although he thinks he feels better with taking methimazole as needed for his nonspecific symptoms of anxiety, irritability and feeling hot in his chest this needs to be objectively assessed Appears to be taking about 3 or 4 tablets of methimazole 5 mg a week  Currently the thyroid functions are normal, relatively higher free T4 may be from his biotin  Recommendation: Discussed that if he needs to be on long-term methimazole he would be better off doing the I-131 treatment and then going on levothyroxine which would be much easier to adjust and follow Given information on I-131 treatment In the meantime need to establish whether he is still truly hyperthyroid or not without any medication  He will go off methimazole completely for a month Advised him that he can use metoprolol as needed for symptom but he does not want to do this Also he will not take any of his men's multivitamin for 2 weeks before his labs  If he has persistently worsening symptoms he will call us and get checked before 1 month  Ian Singleton 10/11/2018, 11:22 AM

## 2018-11-11 ENCOUNTER — Other Ambulatory Visit (INDEPENDENT_AMBULATORY_CARE_PROVIDER_SITE_OTHER): Payer: Commercial Managed Care - PPO

## 2018-11-11 DIAGNOSIS — E0590 Thyrotoxicosis, unspecified without thyrotoxic crisis or storm: Secondary | ICD-10-CM

## 2018-11-11 LAB — TSH: TSH: 0.17 u[IU]/mL — ABNORMAL LOW (ref 0.35–4.50)

## 2018-11-11 LAB — T4, FREE: Free T4: 1.04 ng/dL (ref 0.60–1.60)

## 2018-11-11 LAB — T3, FREE: T3 FREE: 4.1 pg/mL (ref 2.3–4.2)

## 2018-11-12 ENCOUNTER — Other Ambulatory Visit: Payer: Commercial Managed Care - PPO

## 2018-11-15 ENCOUNTER — Ambulatory Visit (INDEPENDENT_AMBULATORY_CARE_PROVIDER_SITE_OTHER): Payer: Commercial Managed Care - PPO | Admitting: Endocrinology

## 2018-11-15 ENCOUNTER — Encounter: Payer: Self-pay | Admitting: Endocrinology

## 2018-11-15 VITALS — BP 158/82 | HR 85 | Temp 97.4°F | Ht 78.0 in | Wt 286.0 lb

## 2018-11-15 DIAGNOSIS — E0590 Thyrotoxicosis, unspecified without thyrotoxic crisis or storm: Secondary | ICD-10-CM

## 2018-11-15 DIAGNOSIS — E0500 Thyrotoxicosis with diffuse goiter without thyrotoxic crisis or storm: Secondary | ICD-10-CM | POA: Diagnosis not present

## 2018-11-15 MED ORDER — METHIMAZOLE 5 MG PO TABS: 2.5000 mg | ORAL_TABLET | Freq: Every day | ORAL | 3 refills | Status: DC

## 2018-11-15 NOTE — Progress Notes (Signed)
Patient ID: Ian Singleton, male   DOB: 23-Jan-1976, 43 y.o.   MRN: [id number]                                                                                                                Reason for Appointment:  Hyperthyroidism, follow-up  Referring physician: Stoneking   History of Present Illness:   The patient started having symptoms of palpitations and some shakiness in the early part of the year He also noticed that occasionally he would feel a little sweaty and have difficulty sleeping He was having some weakness in his muscles when trying to lift weights or do squats. Although he tends to fast periodically to lose weight he thinks he has lost 5-10 pounds on his own without trying No significant heat intolerance, has had fatigue  When first evaluated with thyroid function tests in 3/17 his free T4 was mildly increased at 1.65, normal <1.12 Follow-up level of free T4 on 6/17 was 1.99  Thyroid stimulating immunoglobulin in 6/17= 189, normal <139  He had taken methimazole 30 mg daily since his initial consultation in 04/2016, the dose was reduced on his last visit as free T4 was down to 0.76  Previously and 11/17 his free T4 was mildly decreased on 15 mg methimazole He was told to cut the dose down to 5 mg for 2 weeks and then stop In July 2018 he started having some palpitations, feeling hot and anxious feeling On his own he started back on 5 mg methimazole without consulting [place] or having any labs Since his free T4 and TSH were quite normal in September 2018 he was continued on the same dose  RECENT history: More recently he had not been in follow-up since 08/2017 and was seen again in 10/2018 At that time he reported that he had been taking methimazole on and off on his own depending on how he is feeling  His labs in 10/2018 showed normal free T4 and T3 and also TSH is normal Free T4 was slightly higher than before but he also takes a vitamin with 300 mg of biotin  Since it  was unclear whether he still has hyperthyroidism he was told to stop his methimazole for a month and come back He says that more recently he is starting to feel little jittery, occasionally a little hot and his pulse being fast at times Also has had more stress at work  His free T3 is upper normal and TSH is below normal at 0.17 although free T4 is back to previous levels  The thyrotropin receptor antibody is also again normal  Eye disease: He says that he has a little redness and pressure in the right eye especially on looking to the sides and on his own he is taking selenium supplements  Wt Readings from Last 3 Encounters:  11/15/18 286 lb (129.7 kg)  10/11/18 286 lb (129.7 kg)  08/20/17 282 lb 9.6 oz (128.2 kg)    Lab Results  Component Value Date   FREET4 1.04  11/11/2018   FREET4 1.38 10/07/2018   FREET4 0.95 08/17/2017   T3FREE 4.1 11/11/2018   T3FREE 3.6 10/07/2018   T3FREE 3.1 06/24/2017   TSH 0.17 (L) 11/11/2018   TSH 0.855 10/07/2018   TSH 0.62 08/17/2017   Lab Results  Component Value Date   THYROTRECAB <1.10 10/07/2018   THYROTRECAB 0.53 08/17/2017   THYROTRECAB 1.08 11/03/2016   THYROTRECAB 1.37 07/02/2016      Allergies as of 11/15/2018   No Known Allergies     Medication List       Accurate as of November 15, 2018  8:59 PM. Always use your most recent med list.        methimazole 5 MG tablet Commonly known as:  TAPAZOLE Take 0.5 tablets (2.5 mg total) by mouth daily.   multivitamin with minerals Tabs tablet Take 1 tablet by mouth daily.   VITAMIN E PO Take 1 capsule by mouth daily.           No past medical history on file.  No past surgical history on file.  Family History  Problem Relation Age of Onset  . Hypothyroidism Mother     Social History:  reports that he has never smoked. He has never used smokeless tobacco. No history on file for alcohol and drug.  Allergies: No Known Allergies  `  Review of Systems   He says he  checks his blood pressure at home and it is usually not high except recently when he has been stressed  BP Readings from Last 3 Encounters:  11/15/18 (!) 158/82  10/11/18 114/70  04/17/18 (!) 152/95     Examination:   BP (!) 158/82   Pulse 85   Temp (!) 97.4 F (36.3 C) (Oral)   Ht 6\' 6"  (1.981 m)   Wt 286 lb (129.7 kg)   SpO2 97%   BMI 33.05 kg/m    He has prominence of his eyes especially right with slight both upper and lower lid retraction Mild erythema of lateral conjunctiva Normal range of movement of both eyes laterally and medially Exopthalmometry 26 mm on the right and 22 mm on the left    Assessment/Plan:   Hyperthyroidism,  from Graves' disease initially diagnosed in 03/2016   He has been on treatment with methimazole since 7/18  He is having mild persistent hyperthyroidism Although thyroid levels are still in the normal range without methimazole T3 level is high normal and TSH is now getting lower than normal. 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 despite his mild hyperthyroidism and has right eye proptosis and irritation symptoms   Recommendation: Discussed current state of his hyperthyroidism and autoimmune disease Also discussed pros and cons for antithyroid drugs and I-131 treatment  Methimazole 5 mg every other day or half tablet daily will be restarted Since he is requiring only small doses of methimazole he can be treated with methimazole for up to another year He is also not a candidate for I-131 treatment at this time because of potentially exacerbating his disease He will be seen by an ophthalmologist to establish care and follow-up In the meantime recommended he get artificial teardrops OTC Follow-up in 6 weeks  Counseling time on subjects discussed in assessment and plan sections is over 50% of today's 25 minute visit   Reather Littler 11/15/2018, 8:59 PM

## 2019-03-27 ENCOUNTER — Other Ambulatory Visit: Payer: Self-pay | Admitting: Endocrinology

## 2019-04-06 ENCOUNTER — Other Ambulatory Visit: Payer: Self-pay

## 2019-04-06 MED ORDER — METHIMAZOLE 5 MG PO TABS: 2.5000 mg | ORAL_TABLET | Freq: Every day | ORAL | 3 refills | Status: DC
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
## 2020-06-30 ENCOUNTER — Other Ambulatory Visit: Payer: Self-pay | Admitting: Endocrinology

## 2020-08-22 ENCOUNTER — Other Ambulatory Visit: Payer: Self-pay | Admitting: Endocrinology

## 2020-08-22 MED ORDER — METHIMAZOLE 5 MG PO TABS: 2.5000 mg | ORAL_TABLET | Freq: Every day | ORAL | 0 refills | Status: DC

## 2020-08-22 NOTE — Addendum Note (Signed)
Addended by: Tawnya Crook on: 08/22/2020 11:40 AM   Modules accepted: Orders

## 2020-08-22 NOTE — Telephone Encounter (Signed)
Patient called re: Patient will be getting treatment from a new Endocrinologist closer to his home in January, however patient requests a RX for 60 tablets of methimazole (TAPAZOLE) 5 MG tablet be sent to  [id number] [place], [place] - [address] Phone:  [phone]  Fax:  [phone]

## 2020-08-22 NOTE — Telephone Encounter (Signed)
Noted. Will refill for 30 days supply.

## 2020-09-18 ENCOUNTER — Other Ambulatory Visit: Payer: Self-pay | Admitting: Endocrinology

## 2023-01-13 ENCOUNTER — Other Ambulatory Visit: Payer: Self-pay | Admitting: Family Medicine

## 2023-01-13 DIAGNOSIS — N442 Benign cyst of testis: Secondary | ICD-10-CM

## 2023-01-22 ENCOUNTER — Ambulatory Visit
Admission: RE | Admit: 2023-01-22 | Discharge: 2023-01-22 | Disposition: A | Discharge status: Home / Self Care | Payer: 59 | Source: Ambulatory Visit | Attending: Family Medicine | Admitting: Family Medicine

## 2023-01-22 DIAGNOSIS — N442 Benign cyst of testis: Secondary | ICD-10-CM | POA: Insufficient documentation

## 2023-01-29 ENCOUNTER — Ambulatory Visit (AMBULATORY_SURGERY_CENTER): Payer: 59

## 2023-01-29 VITALS — Ht 78.0 in | Wt 285.0 lb

## 2023-01-29 DIAGNOSIS — Z1211 Encounter for screening for malignant neoplasm of colon: Secondary | ICD-10-CM

## 2023-01-29 MED ORDER — NA SULFATE-K SULFATE-MG SULF 17.5-3.13-1.6 GM/177ML PO SOLN: 1.0000 | Freq: Once | ORAL | 0 refills | Status: AC

## 2023-01-29 NOTE — Progress Notes (Signed)
Pre visit completed via phone call; Patient verified name, DOB, and address;  No egg or soy allergy known to patient;  No issues known to pt with past sedation with any surgeries or procedures; Patient denies ever being told they had issues or difficulty with intubation;  No FH of Malignant Hyperthermia; Pt is not on diet pills; Pt is not on home 02;  Pt is not on blood thinners;  Pt denies issues with constipation;  No A fib or A flutter; Have any cardiac testing pending--NO Pt instructed to use [email or web address] or GoodRx for a price reduction on prep;   Insurance verified during PV appt=[employer]  Patient's chart reviewed by Cathlyn Parsons CNRA prior to previsit and patient appropriate for the [facility].  Previsit completed and red dot placed by patient's name on their procedure day (on provider's schedule).    Instructions sent via MyChart as patient reports he is able to retrieve instructions and print them for himself;

## 2023-01-30 ENCOUNTER — Encounter: Payer: Self-pay | Admitting: Internal Medicine

## 2023-02-09 ENCOUNTER — Ambulatory Visit (AMBULATORY_SURGERY_CENTER): Payer: 59 | Admitting: Internal Medicine

## 2023-02-09 ENCOUNTER — Encounter: Payer: Self-pay | Admitting: Internal Medicine

## 2023-02-09 VITALS — BP 126/90 | HR 62 | Temp 97.8°F | Resp 11 | Ht 78.0 in | Wt 285.0 lb

## 2023-02-09 DIAGNOSIS — Z1211 Encounter for screening for malignant neoplasm of colon: Secondary | ICD-10-CM

## 2023-02-09 DIAGNOSIS — D123 Benign neoplasm of transverse colon: Secondary | ICD-10-CM

## 2023-02-09 DIAGNOSIS — K635 Polyp of colon: Secondary | ICD-10-CM | POA: Diagnosis not present

## 2023-02-09 MED ORDER — SODIUM CHLORIDE 0.9 % IV SOLN
500.0000 mL | INTRAVENOUS | Status: DC
Start: 2023-02-09 — End: 2023-02-09

## 2023-02-09 NOTE — Op Note (Signed)
[facility] Patient Name: Ian Singleton Procedure Date: 02/09/2023 11:07 AM MRN: [id number] Endoscopist: Iva Boop , MD, [id number] Age: 47 Referring MD:  Date of Birth: 02-Mar-1976 Gender: Male Account #: [id number] Procedure:                Colonoscopy Indications:              Screening for colorectal malignant neoplasm, This                            is the patient's first colonoscopy Medicines:                Monitored Anesthesia Care Procedure:                Pre-Anesthesia Assessment:                           - Prior to the procedure, a History and Physical                            was performed, and patient medications and                            allergies were reviewed. The patient's tolerance of                            previous anesthesia was also reviewed. The risks                            and benefits of the procedure and the sedation                            options and risks were discussed with the patient.                            All questions were answered, and informed consent                            was obtained. Prior Anticoagulants: The patient has                            taken no anticoagulant or antiplatelet agents. ASA                            Grade Assessment: II - A patient with mild systemic                            disease. After reviewing the risks and benefits,                            the patient was deemed in satisfactory condition to                            undergo the procedure.  After obtaining informed consent, the colonoscope                            was passed under direct vision. Throughout the                            procedure, the patient's blood pressure, pulse, and                            oxygen saturations were monitored continuously. The                            CF [id number] #[id number] was introduced through the anus                            and advanced to the  the cecum, identified by                            appendiceal orifice and ileocecal valve. The                            colonoscopy was somewhat difficult due to                            significant looping. Successful completion of the                            procedure was aided by applying abdominal pressure.                            The patient tolerated the procedure well. The                            quality of the bowel preparation was excellent. The                            bowel preparation used was SUPREP via split dose                            instruction. Scope In: 11:34:40 AM Scope Out: 11:51:16 AM Scope Withdrawal Time: 0 hours 12 minutes 55 seconds  Total Procedure Duration: 0 hours 16 minutes 36 seconds  Findings:                 The perianal and digital rectal examinations were                            normal. Pertinent negatives include normal prostate                            (size, shape, and consistency).                           A diminutive polyp was found in the transverse  colon. The polyp was sessile. The polyp was removed                            with a cold snare. Resection and retrieval were                            complete. Verification of patient identification                            for the specimen was done. Estimated blood loss was                            minimal.                           A few large-mouthed diverticula were found in the                            sigmoid colon.                           External and internal hemorrhoids were found.                           The exam was otherwise without abnormality on                            direct and retroflexion views. Complications:            No immediate complications. Estimated Blood Loss:     Estimated blood loss was minimal. Impression:               - One diminutive polyp in the transverse colon,                             removed with a cold snare. Resected and retrieved.                           - Diverticulosis in the sigmoid colon.                           - External and internal hemorrhoids.                           - The examination was otherwise normal on direct                            and retroflexion views. Recommendation:           - Patient has a contact number available for                            emergencies. The signs and symptoms of potential                            delayed complications were discussed with the  patient. Return to normal activities tomorrow.                            Written discharge instructions were provided to the                            patient.                           - Resume previous diet.                           - Continue present medications.                           - Await pathology results.                           - Repeat colonoscopy is recommended. The                            colonoscopy date will be determined after pathology                            results from today's exam become available for                            review.                           - Hemorrhoid banding evaluation if desired. Will                            discuss. Iva Boop, MD 02/09/2023 12:06:06 PM This report has been signed electronically.

## 2023-02-09 NOTE — Progress Notes (Signed)
Called to room to assist during endoscopic procedure.  Patient ID and intended procedure confirmed with present staff. Received instructions for my participation in the procedure from the performing physician.  

## 2023-02-09 NOTE — Patient Instructions (Addendum)
I found and removed one tiny polyp that looks benign.  I will let you know pathology results and when to have another routine colonoscopy by mail and/or My Chart.  You also have a condition called diverticulosis - common and not usually a problem. Please read the handout provided.  I appreciate the opportunity to care for you. Iva Boop, MD, [facility]   Handouts Provided:  Polyps, Diverticulosis and Hemorrhoid Banding  YOU HAD AN ENDOSCOPIC PROCEDURE TODAY AT THE [facility]:   Refer to the procedure report that was given to you for any specific questions about what was found during the examination.  If the procedure report does not answer your questions, please call your gastroenterologist to clarify.  If you requested that your care partner not be given the details of your procedure findings, then the procedure report has been included in a sealed envelope for you to review at your convenience later.  YOU SHOULD EXPECT: Some feelings of bloating in the abdomen. Passage of more gas than usual.  Walking can help get rid of the air that was put into your GI tract during the procedure and reduce the bloating. If you had a lower endoscopy (such as a colonoscopy or flexible sigmoidoscopy) you may notice spotting of blood in your stool or on the toilet paper. If you underwent a bowel prep for your procedure, you may not have a normal bowel movement for a few days.  Please Note:  You might notice some irritation and congestion in your nose or some drainage.  This is from the oxygen used during your procedure.  There is no need for concern and it should clear up in a day or so.  SYMPTOMS TO REPORT IMMEDIATELY:  Following lower endoscopy (colonoscopy or flexible sigmoidoscopy):  Excessive amounts of blood in the stool  Significant tenderness or worsening of abdominal pains  Swelling of the abdomen that is new, acute  Fever of 100F or higher   For urgent or emergent issues, a  gastroenterologist can be reached at any hour by calling [phone]. Do not use MyChart messaging for urgent concerns.    DIET:  We do recommend a small meal at first, but then you may proceed to your regular diet.  Drink plenty of fluids but you should avoid alcoholic beverages for 24 hours.  ACTIVITY:  You should plan to take it easy for the rest of today and you should NOT DRIVE or use heavy machinery until tomorrow (because of the sedation medicines used during the test).    FOLLOW UP: Our staff will call the number listed on your records the next business day following your procedure.  We will call around 7:15- 8:00 am to check on you and address any questions or concerns that you may have regarding the information given to you following your procedure. If we do not reach you, we will leave a message.     If any biopsies were taken you will be contacted by phone or by letter within the next 1-3 weeks.  Please call us at [phone] if you have not heard about the biopsies in 3 weeks.    SIGNATURES/CONFIDENTIALITY: You and/or your care partner have signed paperwork which will be entered into your electronic medical record.  These signatures attest to the fact that that the information above on your After Visit Summary has been reviewed and is understood.  Full responsibility of the confidentiality of this discharge information lies with you and/or your  care-partner.  

## 2023-02-09 NOTE — Progress Notes (Signed)
[facility] History and Physical   Primary Care Physician:  Dorothey Baseman, MD   Reason for Procedure:   CRCA screening  Plan:    colonoscopy     HPI: Ian Singleton is a 47 y.o. male here for screening exam   Past Medical History:  Diagnosis Date   Graves disease    managed without medications   Heart murmur    since childhood-was told "nothing to worry about"    Past Surgical History:  Procedure Laterality Date   WISDOM TOOTH EXTRACTION      Prior to Admission medications   Medication Sig Start Date End Date Taking? Authorizing Provider  Multiple Vitamin (MULTIVITAMIN WITH MINERALS) TABS tablet Take 1 tablet by mouth daily.   Yes [provider]    Current Outpatient Medications  Medication Sig Dispense Refill   Multiple Vitamin (MULTIVITAMIN WITH MINERALS) TABS tablet Take 1 tablet by mouth daily.     Current Facility-Administered Medications  Medication Dose Route Frequency Provider Last Rate Last Admin   0.9 %  sodium chloride infusion  500 mL Intravenous Continuous Iva Boop, MD        Allergies as of 02/09/2023   (No Known Allergies)    Family History  Problem Relation Age of Onset   Hypothyroidism Mother    Colon polyps Neg Hx    Colon cancer Neg Hx    Esophageal cancer Neg Hx    Rectal cancer Neg Hx    Stomach cancer Neg Hx     Social History   Socioeconomic History   Marital status: Single    Spouse name: Not on file   Number of children: Not on file   Years of education: Not on file   Highest education level: Not on file  Occupational History   Not on file  Tobacco Use   Smoking status: Former    Types: Cigarettes   Smokeless tobacco: Never  Vaping Use   Vaping Use: Former  Substance and Sexual Activity   Alcohol use: Not Currently    Alcohol/week: 0.0 - 7.0 standard drinks of alcohol   Drug use: Never   Sexual activity: Not on file  Other Topics Concern   Not on file  Social History Narrative    Not on file   Social Determinants of Health   Financial Resource Strain: Not on file  Food Insecurity: Not on file  Transportation Needs: Not on file  Physical Activity: Not on file  Stress: Not on file  Social Connections: Not on file  Intimate Partner Violence: Not on file    Review of Systems:  All other review of systems negative except as mentioned in the HPI.  Physical Exam: Vital signs BP 120/66   Pulse 75   Temp 97.8 F (36.6 C)   Ht 6\' 6"  (1.981 m)   Wt 285 lb (129.3 kg)   SpO2 96%   BMI 32.94 kg/m   General:   Alert,  Well-developed, well-nourished, pleasant and cooperative in NAD Lungs:  Clear throughout to auscultation.   Heart:  Regular rate and rhythm; no murmurs, clicks, rubs,  or gallops. Abdomen:  Soft, nontender and nondistended. Normal bowel sounds.   Neuro/Psych:  Alert and cooperative. Normal mood and affect. A and O x 3   @Takako Minckler  Sena Slate, MD, [facility] [phone] (pager) 02/09/2023 11:21 AM@

## 2023-02-09 NOTE — Progress Notes (Signed)
Vss nad trans to pacu 

## 2023-02-10 ENCOUNTER — Telehealth: Payer: Self-pay

## 2023-02-10 NOTE — Telephone Encounter (Signed)
Attempted follow up; no answer; left VM

## 2023-02-20 ENCOUNTER — Encounter: Payer: Self-pay | Admitting: Internal Medicine

## 2023-02-20 DIAGNOSIS — Z860100 Personal history of colon polyps, unspecified: Secondary | ICD-10-CM

## 2023-02-20 HISTORY — DX: Personal history of colon polyps, unspecified: Z86.0100
